# Patient Record
Sex: Female | Born: 1955 | Race: White | Hispanic: No | Marital: Married | Smoking: Never smoker
Health system: Southern US, Community
[De-identification: ages and names within clinical notes are randomized; demographics above are authoritative.]

## PROBLEM LIST (undated history)

## (undated) DIAGNOSIS — C50919 Malignant neoplasm of unspecified site of unspecified female breast: Secondary | ICD-10-CM

## (undated) HISTORY — DX: Malignant neoplasm of unspecified site of unspecified female breast: C50.919

---

## 1997-01-09 HISTORY — PX: MASTECTOMY: SHX3

## 2004-10-24 ENCOUNTER — Emergency Department: Payer: Self-pay | Admitting: Emergency Medicine

## 2006-06-25 ENCOUNTER — Ambulatory Visit: Payer: Self-pay | Admitting: Obstetrics and Gynecology

## 2008-06-23 ENCOUNTER — Ambulatory Visit: Payer: Self-pay | Admitting: Obstetrics and Gynecology

## 2008-06-24 ENCOUNTER — Ambulatory Visit: Payer: Self-pay | Admitting: Obstetrics and Gynecology

## 2009-07-07 ENCOUNTER — Ambulatory Visit: Payer: Self-pay | Admitting: Obstetrics and Gynecology

## 2010-09-26 ENCOUNTER — Ambulatory Visit: Payer: Self-pay | Admitting: Obstetrics and Gynecology

## 2013-01-20 ENCOUNTER — Ambulatory Visit: Payer: Self-pay | Admitting: Obstetrics and Gynecology

## 2013-03-17 ENCOUNTER — Ambulatory Visit: Payer: Self-pay | Admitting: Unknown Physician Specialty

## 2013-12-03 DIAGNOSIS — R079 Chest pain, unspecified: Secondary | ICD-10-CM | POA: Insufficient documentation

## 2015-05-27 ENCOUNTER — Other Ambulatory Visit: Payer: Self-pay | Admitting: Obstetrics and Gynecology

## 2015-05-27 DIAGNOSIS — Z1231 Encounter for screening mammogram for malignant neoplasm of breast: Secondary | ICD-10-CM

## 2015-06-15 ENCOUNTER — Ambulatory Visit: Payer: Self-pay

## 2015-06-17 ENCOUNTER — Ambulatory Visit: Payer: Self-pay

## 2015-06-23 ENCOUNTER — Other Ambulatory Visit: Payer: Self-pay | Admitting: Obstetrics and Gynecology

## 2015-06-23 ENCOUNTER — Ambulatory Visit
Admission: RE | Admit: 2015-06-23 | Discharge: 2015-06-23 | Disposition: A | Discharge status: Home / Self Care | Payer: BLUE CROSS/BLUE SHIELD | Source: Ambulatory Visit | Attending: Obstetrics and Gynecology | Admitting: Obstetrics and Gynecology

## 2015-06-23 DIAGNOSIS — Z1231 Encounter for screening mammogram for malignant neoplasm of breast: Secondary | ICD-10-CM

## 2015-08-04 ENCOUNTER — Inpatient Hospital Stay: Payer: BLUE CROSS/BLUE SHIELD

## 2015-08-04 ENCOUNTER — Encounter (INDEPENDENT_AMBULATORY_CARE_PROVIDER_SITE_OTHER): Payer: Self-pay

## 2015-08-04 ENCOUNTER — Encounter: Payer: Self-pay | Admitting: Oncology

## 2015-08-04 ENCOUNTER — Inpatient Hospital Stay: Payer: BLUE CROSS/BLUE SHIELD | Attending: Oncology | Admitting: Oncology

## 2015-08-04 DIAGNOSIS — Z853 Personal history of malignant neoplasm of breast: Secondary | ICD-10-CM

## 2015-08-04 DIAGNOSIS — Z803 Family history of malignant neoplasm of breast: Secondary | ICD-10-CM | POA: Diagnosis not present

## 2015-08-04 DIAGNOSIS — Z9011 Acquired absence of right breast and nipple: Secondary | ICD-10-CM

## 2015-08-04 DIAGNOSIS — Z1379 Encounter for other screening for genetic and chromosomal anomalies: Secondary | ICD-10-CM

## 2015-08-07 NOTE — Progress Notes (Signed)
[facility]  [phone] [phone]  ID: Sara Herman OB: Jul 12, 1955  MR#: [id number]  [id number]  No care team member to display  CHIEF COMPLAINT: Genetic testing for personal history of breast cancer.  INTERVAL HISTORY: Patient is a 60 year old female with a personal history of breast cancer at the age of 89. Patient indicates she only had treatment with mastectomy. She did not have radiation, chemotherapy, or adjuvant hormonal treatment. She also has a grandmother that had breast cancer in her 74s. She has no other evidence of malignancy in her family. Currently, she feels well and is asymptomatic. She is no neurologic complaints. She denies any recent fevers or illnesses. She denies any pain. She has a good appetite and denies weight loss. She denies any chest pain or shortness of breath. She denies any nausea, vomiting, constipation, or diarrhea. She has no urinary complaints. Patient feels at her baseline and offers no specific complaints today.  REVIEW OF SYSTEMS:   Review of Systems  Constitutional: Negative.  Negative for fever, malaise/fatigue and weight loss.  Respiratory: Negative.  Negative for shortness of breath.   Cardiovascular: Negative.  Negative for chest pain.  Gastrointestinal: Negative.   Genitourinary: Negative.   Musculoskeletal: Negative.   Neurological: Negative.  Negative for weakness.  Psychiatric/Behavioral: Negative.  The patient is not nervous/anxious.     As per HPI. Otherwise, a complete review of systems is negatve.  PAST MEDICAL HISTORY: Past Medical History:  Diagnosis Date  . Breast cancer ([place])     PAST SURGICAL HISTORY: Past Surgical History:  Procedure Laterality Date  . MASTECTOMY Right 1999    FAMILY HISTORY: Family History  Problem Relation Age of Onset  . Breast cancer Paternal Grandmother   . Heart Problems Father   . Diabetes Sister   . Heart Problems Sister         ADVANCED DIRECTIVES:    HEALTH MAINTENANCE: Social History  Substance Use Topics  . Smoking status: Never Smoker  . Smokeless tobacco: Never Used  . Alcohol use No     Colonoscopy:  PAP:  Bone density:  Lipid panel:  Allergies  Allergen Reactions  . Latex Rash and Dermatitis    No current outpatient prescriptions on file.   No current facility-administered medications for this visit.     OBJECTIVE: There were no vitals filed for this visit.   There is no height or weight on file to calculate BMI.    ECOG FS:0 - Asymptomatic  General: Well-developed, well-nourished, no acute distress. Eyes: Pink conjunctiva, anicteric sclera. Musculoskeletal: No edema, cyanosis, or clubbing. Neuro: Alert, answering all questions appropriately. Cranial nerves grossly intact. Skin: No rashes or petechiae noted. Psych: Normal affect. Lymphatics: No cervical, calvicular, axillary or inguinal LAD.   LAB RESULTS:  No results found for: NA, K, CL, CO2, GLUCOSE, BUN, CREATININE, CALCIUM, PROT, ALBUMIN, AST, ALT, ALKPHOS, BILITOT, GFRNONAA, GFRAA  No results found for: WBC, NEUTROABS, HGB, HCT, MCV, PLT   STUDIES: No results found.  ASSESSMENT: Genetic testing for personal history of breast cancer.  PLAN:    1. Genetic testing: Although patient had breast cancer nearly 17 years ago, she was under the age of 89 at her diagnosis therefore she will qualify for genetic testing today. She also has a family history of breast cancer in her paternal grandmother. Will proceed with My Risk testing today. Patient has 1 son and 1 daughter in their mid-20s that she is also interested in having tested if she  is positive. If negative, no further follow-up is necessary. She has been instructed to ensure that she and her daughter continue to get regular mammograms as indicated. If positive, patient will return to clinic to discuss the results as well as preventative options. We will also discuss if  and when to test her children at that time.  Approximately 45 minutes was spent in discussion of which greater than 50% was consultation.  Patient expressed understanding and was in agreement with this plan. She also understands that She can call clinic at any time with any questions, concerns, or complaints.   Sara Huger, MD   08/07/2015 11:54 AM

## 2015-08-19 ENCOUNTER — Telehealth: Payer: Self-pay | Admitting: *Deleted

## 2015-08-19 NOTE — Telephone Encounter (Signed)
Notified patient of negative genetic test result.

## 2015-08-23 ENCOUNTER — Telehealth: Payer: Self-pay | Admitting: *Deleted

## 2015-08-23 NOTE — Telephone Encounter (Signed)
Pt got a letter from [facility] stating that [facility] testing was denied. Pt concerned and would like to discuss this letter with someone. Informed pt that the best contact to handle her situation would be [facility] who processed her MyRisk testing. Informed pt to contact [facility] at [phone]. Pt verbalized understanding.

## 2015-08-29 ENCOUNTER — Encounter: Payer: Self-pay | Admitting: Oncology

## 2015-10-19 ENCOUNTER — Telehealth: Payer: Self-pay | Admitting: *Deleted

## 2015-10-19 NOTE — Telephone Encounter (Signed)
States she got a denial from her insurance company that they will not cover the genetic testing she had done. She states [facility] is ready to turn her in to collections and would appreciate any help you can give her with this. She states she was told they would not run test if her insurance did not cover it and would appreciate anything we can do to assist her in this matter.

## 2015-10-20 NOTE — Telephone Encounter (Signed)
She did and they assure her she will owe nothing. This occurred after she and I spoke and I had advised her to call them

## 2015-10-20 NOTE — Telephone Encounter (Signed)
Our office will not be able to assist patient with this. She will need to discuss this with [facility].  

## 2015-11-24 ENCOUNTER — Other Ambulatory Visit: Payer: Self-pay | Admitting: Internal Medicine

## 2015-11-24 ENCOUNTER — Ambulatory Visit
Admission: RE | Admit: 2015-11-24 | Discharge: 2015-11-24 | Disposition: A | Discharge status: Home / Self Care | Payer: BLUE CROSS/BLUE SHIELD | Source: Ambulatory Visit | Attending: Internal Medicine | Admitting: Internal Medicine

## 2015-11-24 DIAGNOSIS — K5793 Diverticulitis of intestine, part unspecified, without perforation or abscess with bleeding: Secondary | ICD-10-CM

## 2015-11-24 DIAGNOSIS — K37 Unspecified appendicitis: Secondary | ICD-10-CM | POA: Diagnosis not present

## 2015-11-24 DIAGNOSIS — R1084 Generalized abdominal pain: Secondary | ICD-10-CM

## 2015-11-24 DIAGNOSIS — R509 Fever, unspecified: Secondary | ICD-10-CM

## 2015-11-24 DIAGNOSIS — K8020 Calculus of gallbladder without cholecystitis without obstruction: Secondary | ICD-10-CM | POA: Diagnosis not present

## 2015-11-24 DIAGNOSIS — K819 Cholecystitis, unspecified: Secondary | ICD-10-CM

## 2015-11-24 DIAGNOSIS — R109 Unspecified abdominal pain: Secondary | ICD-10-CM

## 2015-11-24 MED ORDER — IOPAMIDOL (ISOVUE-300) INJECTION 61%: 100.0000 mL | Freq: Once | INTRAVENOUS | Status: DC | PRN

## 2015-11-26 ENCOUNTER — Ambulatory Visit: Payer: BLUE CROSS/BLUE SHIELD | Admitting: Anesthesiology

## 2015-11-26 ENCOUNTER — Ambulatory Visit
Admission: RE | Admit: 2015-11-26 | Discharge: 2015-11-26 | Disposition: A | Discharge status: Home / Self Care | Payer: BLUE CROSS/BLUE SHIELD | Source: Ambulatory Visit | Attending: Surgery | Admitting: Surgery

## 2015-11-26 ENCOUNTER — Encounter: Payer: Self-pay | Admitting: *Deleted

## 2015-11-26 ENCOUNTER — Ambulatory Visit: Payer: BLUE CROSS/BLUE SHIELD

## 2015-11-26 ENCOUNTER — Encounter
Admission: RE | Disposition: A | Discharge status: Home / Self Care | Payer: Self-pay | Source: Ambulatory Visit | Attending: Surgery

## 2015-11-26 DIAGNOSIS — Z853 Personal history of malignant neoplasm of breast: Secondary | ICD-10-CM | POA: Diagnosis not present

## 2015-11-26 DIAGNOSIS — Z419 Encounter for procedure for purposes other than remedying health state, unspecified: Secondary | ICD-10-CM

## 2015-11-26 DIAGNOSIS — K8010 Calculus of gallbladder with chronic cholecystitis without obstruction: Secondary | ICD-10-CM | POA: Diagnosis present

## 2015-11-26 DIAGNOSIS — K8012 Calculus of gallbladder with acute and chronic cholecystitis without obstruction: Secondary | ICD-10-CM | POA: Insufficient documentation

## 2015-11-26 HISTORY — PX: CHOLECYSTECTOMY: SHX55

## 2015-11-26 SURGERY — LAPAROSCOPIC CHOLECYSTECTOMY
Anesthesia: General | Site: Abdomen | Wound class: Clean Contaminated

## 2015-11-26 MED ORDER — ONDANSETRON HCL 4 MG/2ML IJ SOLN
INTRAMUSCULAR | Status: DC | PRN
  Administered 2015-11-26 (×2): 4 mg via INTRAVENOUS

## 2015-11-26 MED ORDER — FENTANYL CITRATE (PF) 100 MCG/2ML IJ SOLN
INTRAMUSCULAR | Status: AC
  Administered 2015-11-26: 25 ug via INTRAVENOUS
  Filled 2015-11-26: qty 2

## 2015-11-26 MED ORDER — HEPARIN SODIUM (PORCINE) 1000 UNIT/ML IJ SOLN
INTRAMUSCULAR | Status: DC | PRN
  Administered 2015-11-26: 250 mL via INTRAMUSCULAR

## 2015-11-26 MED ORDER — SODIUM CHLORIDE 0.9 % IJ SOLN
INTRAMUSCULAR | Status: AC
  Filled 2015-11-26: qty 10

## 2015-11-26 MED ORDER — DEXAMETHASONE SODIUM PHOSPHATE 10 MG/ML IJ SOLN
INTRAMUSCULAR | Status: DC | PRN
  Administered 2015-11-26: 4 mg via INTRAVENOUS

## 2015-11-26 MED ORDER — HEPARIN SODIUM (PORCINE) 5000 UNIT/ML IJ SOLN
INTRAMUSCULAR | Status: AC
  Filled 2015-11-26: qty 1

## 2015-11-26 MED ORDER — KETOROLAC TROMETHAMINE 30 MG/ML IJ SOLN
INTRAMUSCULAR | Status: AC
  Administered 2015-11-26: 30 mg via INTRAVENOUS
  Filled 2015-11-26: qty 1

## 2015-11-26 MED ORDER — SUGAMMADEX SODIUM 200 MG/2ML IV SOLN
INTRAVENOUS | Status: DC | PRN
  Administered 2015-11-26: 200 mg via INTRAVENOUS

## 2015-11-26 MED ORDER — PROPOFOL 10 MG/ML IV BOLUS
INTRAVENOUS | Status: DC | PRN
  Administered 2015-11-26: 150 mg via INTRAVENOUS

## 2015-11-26 MED ORDER — KETOROLAC TROMETHAMINE 30 MG/ML IJ SOLN
30.0000 mg | Freq: Once | INTRAMUSCULAR | Status: AC
  Administered 2015-11-26: 30 mg via INTRAVENOUS

## 2015-11-26 MED ORDER — FENTANYL CITRATE (PF) 100 MCG/2ML IJ SOLN
25.0000 ug | INTRAMUSCULAR | Status: AC | PRN
  Administered 2015-11-26 (×6): 25 ug via INTRAVENOUS

## 2015-11-26 MED ORDER — PROMETHAZINE HCL 25 MG/ML IJ SOLN
INTRAMUSCULAR | Status: AC
  Administered 2015-11-26: 12.5 mg via INTRAVENOUS
  Filled 2015-11-26: qty 1

## 2015-11-26 MED ORDER — FENTANYL CITRATE (PF) 100 MCG/2ML IJ SOLN
INTRAMUSCULAR | Status: DC | PRN
  Administered 2015-11-26 (×2): 50 ug via INTRAVENOUS
  Administered 2015-11-26: 100 ug via INTRAVENOUS
  Administered 2015-11-26: 50 ug via INTRAVENOUS

## 2015-11-26 MED ORDER — SODIUM CHLORIDE 0.9 % IJ SOLN
INTRAMUSCULAR | Status: AC
  Filled 2015-11-26: qty 50

## 2015-11-26 MED ORDER — LACTATED RINGERS IV SOLN
INTRAVENOUS | Status: DC
  Administered 2015-11-26 (×3): via INTRAVENOUS

## 2015-11-26 MED ORDER — ONDANSETRON HCL 4 MG PO TABS: 4.0000 mg | ORAL_TABLET | ORAL | 0 refills | Status: AC | PRN

## 2015-11-26 MED ORDER — CEFOTETAN DISODIUM 2 G IJ SOLR
2.0000 g | Freq: Once | INTRAMUSCULAR | Status: AC
  Administered 2015-11-26: 2 g via INTRAVENOUS
  Filled 2015-11-26: qty 2

## 2015-11-26 MED ORDER — HYDROCODONE-ACETAMINOPHEN 5-325 MG PO TABS: 1.0000 | ORAL_TABLET | ORAL | 0 refills | Status: AC | PRN

## 2015-11-26 MED ORDER — MIDAZOLAM HCL 2 MG/2ML IJ SOLN
INTRAMUSCULAR | Status: DC | PRN
  Administered 2015-11-26: 2 mg via INTRAVENOUS

## 2015-11-26 MED ORDER — PROMETHAZINE HCL 25 MG/ML IJ SOLN
6.2500 mg | INTRAMUSCULAR | Status: AC | PRN
  Administered 2015-11-26 (×2): 12.5 mg via INTRAVENOUS

## 2015-11-26 MED ORDER — HYDROCODONE-ACETAMINOPHEN 5-325 MG PO TABS: 1.0000 | ORAL_TABLET | ORAL | Status: DC | PRN

## 2015-11-26 MED ORDER — PHENYLEPHRINE HCL 10 MG/ML IJ SOLN
INTRAMUSCULAR | Status: DC | PRN
  Administered 2015-11-26 (×3): 100 ug via INTRAVENOUS

## 2015-11-26 MED ORDER — ROCURONIUM BROMIDE 100 MG/10ML IV SOLN
INTRAVENOUS | Status: DC | PRN
  Administered 2015-11-26: 10 mg via INTRAVENOUS
  Administered 2015-11-26: 40 mg via INTRAVENOUS
  Administered 2015-11-26: 20 mg via INTRAVENOUS

## 2015-11-26 SURGICAL SUPPLY — 38 items
APPLIER CLIP ROT 10 11.4 M/L (STAPLE) ×4
CANISTER SUCT 1200ML W/VALVE (MISCELLANEOUS) ×4 IMPLANT
CANNULA DILATOR 10 W/SLV (CANNULA) ×3 IMPLANT
CANNULA DILATOR 10MM W/SLV (CANNULA) ×1
CATH REDDICK CHOLANGI 4FR 50CM (CATHETERS) ×4 IMPLANT
CHLORAPREP W/TINT 26ML (MISCELLANEOUS) ×4 IMPLANT
CLIP APPLIE ROT 10 11.4 M/L (STAPLE) ×2 IMPLANT
CLOSURE WOUND 1/2 X4 (GAUZE/BANDAGES/DRESSINGS) ×1
DRAPE SHEET LG 3/4 BI-LAMINATE (DRAPES) ×4 IMPLANT
ELECT REM PT RETURN 9FT ADLT (ELECTROSURGICAL) ×4
ELECTRODE REM PT RTRN 9FT ADLT (ELECTROSURGICAL) ×2 IMPLANT
GAUZE SPONGE 4X4 12PLY STRL (GAUZE/BANDAGES/DRESSINGS) ×4 IMPLANT
GLOVE BIO SURGEON STRL SZ7.5 (GLOVE) ×4 IMPLANT
GOWN STRL REUS W/ TWL LRG LVL3 (GOWN DISPOSABLE) ×8 IMPLANT
GOWN STRL REUS W/TWL LRG LVL3 (GOWN DISPOSABLE) ×8
IRRIGATION STRYKERFLOW (MISCELLANEOUS) ×2 IMPLANT
IRRIGATOR STRYKERFLOW (MISCELLANEOUS) ×4
IV NS 1000ML (IV SOLUTION) ×2
IV NS 1000ML BAXH (IV SOLUTION) ×2 IMPLANT
KIT RM TURNOVER STRD PROC AR (KITS) ×4 IMPLANT
LABEL OR SOLS (LABEL) ×4 IMPLANT
NDL INSUFF ACCESS 14 VERSASTEP (NEEDLE) ×4 IMPLANT
NEEDLE FILTER BLUNT 18X 1/2SAF (NEEDLE) ×2
NEEDLE FILTER BLUNT 18X1 1/2 (NEEDLE) ×2 IMPLANT
NS IRRIG 500ML POUR BTL (IV SOLUTION) ×4 IMPLANT
PACK LAP CHOLECYSTECTOMY (MISCELLANEOUS) ×4 IMPLANT
SCISSORS METZENBAUM CVD 33 (INSTRUMENTS) ×4 IMPLANT
SEAL FOR SCOPE WARMER C3101 (MISCELLANEOUS) ×4 IMPLANT
SLEEVE ENDOPATH XCEL 5M (ENDOMECHANICALS) ×4 IMPLANT
STRIP CLOSURE SKIN 1/2X4 (GAUZE/BANDAGES/DRESSINGS) ×3 IMPLANT
SUT CHROMIC 5 0 RB 1 27 (SUTURE) ×8 IMPLANT
SUT MAXON ABS #0 GS21 30IN (SUTURE) ×4 IMPLANT
SUT VIC AB 0 CT2 27 (SUTURE) IMPLANT
SYR 3ML LL SCALE MARK (SYRINGE) ×4 IMPLANT
TROCAR XCEL NON-BLD 11X100MML (ENDOMECHANICALS) ×4 IMPLANT
TROCAR XCEL NON-BLD 5MMX100MML (ENDOMECHANICALS) ×4 IMPLANT
TUBING INSUFFLATOR HI FLOW (MISCELLANEOUS) ×4 IMPLANT
WATER STERILE IRR 1000ML POUR (IV SOLUTION) ×4 IMPLANT

## 2015-11-26 NOTE — Op Note (Signed)
OPERATIVE REPORT  PREOPERATIVE DIAGNOSIS:  Chronic cholecystitis cholelithiasis  POSTOPERATIVE DIAGNOSIS: Chronic cholecystitis cholelithiasis  PROCEDURE: Laparoscopic cholecystectomy  ANESTHESIA: General  SURGEON: Rochel Brome M.D.  INDICATIONS: She has had recent epigastric pains and CT findings of cholecystitis cholelithiasis with thickened gallbladder wall. Surgery was recommended for definitive treatment  With the patient on the operating table in the supine position under general endotracheal anesthesia the abdomen was prepared with ChloraPrep solution and draped in a sterile manner. A short incision was made in the inferior aspect of the umbilicus and carried down to the deep fascia which was grasped with a laryngeal hook. A Veress needle was inserted aspirated and irrigated with a saline solution. The peritoneal cavity was insufflated with carbon dioxide. The Veress needle was removed. The 10 mm cannula was inserted. The 10 mm 0 laparoscope was inserted to view the peritoneal cavity.  Another incision was made in the epigastrium slightly to the right of the midline to introduce an 11 mm cannula. 2 incisions were made in the lateral aspect of the right upper quadrant to introduce 2   5 mm cannulas. Initial inspection revealed the smooth surface of the liver. The gallbladder appeared to be acutely inflamed with thickened wall. The stomach was distended. I had the anesthetist insert an oral gastric tube to decompress the stomach. Brief survey of the visible intestine appeared typical. The gallbladder was decompressed with a lancing needle draining white bile. The gallbladder was retracted towards the right shoulder.  The gallbladder neck was retracted inferiorly and laterally.  The porta hepatis was identified. The gallbladder was mobilized with incision of the visceral peritoneum. It appeared that there was a large smooth stone impacted in the bladder neck. The cystic duct was dissected free  from surrounding structures. The cystic artery was dissected free from surrounding structures.  The cystic duct was very small and too small for Reddick catheter insertion and was ligated with endoclips and divided. The cystic artery was controlled with double endoclips and divided. The gallbladder was dissected free from the liver with use of hook and cautery and blunt dissection. There was a smooth plane of dissection. There was much thickening of the gallbladder and this became a somewhat tedious dissection. Bleeding was minimal and hemostasis was intact. The gallbladder was delivered up through the infraumbilical incision opened and suctioned. . Due to the large size of the stones and the thickness of the gallbladder wall was necessary to lengthen the infraumbilical incision to approximately 2 cm and also lengthened the fascial defect. The gallbladder was removed and submitted in formalin for routine pathology. The right upper quadrant was further inspected and irrigated and aspirated and hemostasis was intact. The cannulas removed allowing carbon dioxide to escape from the peritoneal cavity. The fascial defect at the umbilicus was closed with a 0 Maxon figure-of-eight suture followed by a simple suture. The skin incisions were closed with interrupted 5-0 chromic subcutaneous suture benzoin and Steri-Strips. Gauze dressings were applied with paper tape.  The patient appeared to be in satisfactory condition and was prepared for transfer to the recovery room  Randall.D.

## 2015-11-26 NOTE — Transfer of Care (Signed)
Immediate Anesthesia Transfer of Care Note  Patient: Sara Herman  Procedure(s) Performed: Procedure(s): LAPAROSCOPIC CHOLECYSTECTOMY (N/A)  Patient Location: PACU  Anesthesia Type:General  Level of Consciousness: sedated  Airway & Oxygen Therapy: Patient Spontanous Breathing and Patient connected to face mask oxygen  Post-op Assessment: Report given to RN and Post -op Vital signs reviewed and stable  Post vital signs: Reviewed and stable  Last Vitals:  Vitals:   11/26/15 1245 11/26/15 1651  BP: (!) 141/77 (!) 150/89  Pulse: 67 80  Resp: 18 16  Temp: 36.9 C [id number] C    Complications: No apparent anesthesia complications

## 2015-11-26 NOTE — H&P (Signed)
  She reports no change in condition since yesterday.  Did have small BM last night.  Abdomen soft and flat with RUQ tenderness  Discussed plan for lap cholecystectomy

## 2015-11-26 NOTE — Anesthesia Preprocedure Evaluation (Signed)
Anesthesia Evaluation  Patient identified by MRN, date of birth, ID band Patient awake    Reviewed: Allergy & Precautions, H&P , NPO status , Patient's Chart, lab work & pertinent test results, reviewed documented beta blocker date and time   History of Anesthesia Complications Negative for: history of anesthetic complications  Airway Mallampati: I  TM Distance: >3 FB Neck ROM: full    Dental  (+) Caps, Teeth Intact   Pulmonary neg pulmonary ROS,           Cardiovascular Exercise Tolerance: Good negative cardio ROS       Neuro/Psych negative neurological ROS  negative psych ROS   GI/Hepatic negative GI ROS, Neg liver ROS,   Endo/Other  negative endocrine ROS  Renal/GU negative Renal ROS  negative genitourinary   Musculoskeletal   Abdominal   Peds  Hematology negative hematology ROS (+)   Anesthesia Other Findings Past Medical History: No date: Breast cancer ([place])   Reproductive/Obstetrics negative OB ROS                             Anesthesia Physical Anesthesia Plan  ASA: II  Anesthesia Plan: General, Rapid Sequence and Cricoid Pressure   Post-op Pain Management:    Induction:   Airway Management Planned:   Additional Equipment:   Intra-op Plan:   Post-operative Plan:   Informed Consent: I have reviewed the patients History and Physical, chart, labs and discussed the procedure including the risks, benefits and alternatives for the proposed anesthesia with the patient or authorized representative who has indicated his/her understanding and acceptance.   Dental Advisory Given  Plan Discussed with: Anesthesiologist, CRNA and Surgeon  Anesthesia Plan Comments:         Anesthesia Quick Evaluation

## 2015-11-26 NOTE — Discharge Instructions (Addendum)
Take Tylenol or Norco as needed for pain.  Should not drive or do anything dangerous going taking Norco.  Can discontinue Cipro and Flagyl.  Continue Zantac and Protonix for 1 week.  Remove dressings on Sunday. May shower Monday.   AMBULATORY SURGERY  DISCHARGE INSTRUCTIONS   1) The drugs that you were given will stay in your system until tomorrow so for the next 24 hours you should not:  A) Drive an automobile B) Make any legal decisions C) Drink any alcoholic beverage   2) You may resume regular meals tomorrow.  Today it is better to start with liquids and gradually work up to solid foods.  You may eat anything you prefer, but it is better to start with liquids, then soup and crackers, and gradually work up to solid foods.   3) Please notify your doctor immediately if you have any unusual bleeding, trouble breathing, redness and pain at the surgery site, drainage, fever, or pain not relieved by medication.    4) Additional Instructions:        Please contact your physician with any problems or Same Day Surgery at [phone], Monday through Friday 6 am to 4 pm, or [facility] number at [phone].

## 2015-11-26 NOTE — Anesthesia Procedure Notes (Signed)
Procedure Name: Intubation Date/Time: 11/26/2015 2:34 PM Performed by: Leander Rams Pre-anesthesia Checklist: Patient identified, Emergency Drugs available, Suction available and Patient being monitored Patient Re-evaluated:Patient Re-evaluated prior to inductionOxygen Delivery Method: Circle system utilized Preoxygenation: Pre-oxygenation with 100% oxygen Intubation Type: IV induction Ventilation: Mask ventilation without difficulty Laryngoscope Size: Mac and 3 Grade View: Grade I Tube type: Oral Tube size: 6.5 mm Number of attempts: 1 Secured at: 20 cm Tube secured with: Tape Dental Injury: Teeth and Oropharynx as per pre-operative assessment

## 2015-11-26 NOTE — Anesthesia Postprocedure Evaluation (Signed)
Anesthesia Post Note  Patient: Sara Herman  Procedure(s) Performed: Procedure(s) (LRB): LAPAROSCOPIC CHOLECYSTECTOMY (N/A)  Patient location during evaluation: PACU Anesthesia Type: General Level of consciousness: awake and alert Pain management: pain level controlled Vital Signs Assessment: post-procedure vital signs reviewed and stable Respiratory status: spontaneous breathing, nonlabored ventilation and respiratory function stable Cardiovascular status: blood pressure returned to baseline and stable Postop Assessment: no signs of nausea or vomiting Anesthetic complications: no    Last Vitals:  Vitals:   11/26/15 1751 11/26/15 1757  BP: 138/64 (!) 142/72  Pulse: 71 71  Resp: 11 18  Temp: 37.4 C 37.2 C    Last Pain:  Vitals:   11/26/15 1757  TempSrc: Temporal  PainSc: 3                  Jaiyana Canale

## 2015-11-29 ENCOUNTER — Encounter: Payer: Self-pay | Admitting: Surgery

## 2015-11-30 LAB — SURGICAL PATHOLOGY

## 2017-03-15 IMAGING — CT CT ABD-PELV W/ CM
2 of 5 series · 15 of 46 positions shown, 17 images · IV contrast (isovue)
Comparison: None.

CLINICAL DATA: Diffuse abdominal cramping since [REDACTED]. Worsening
over the last 2 weeks. Nausea and vomiting.

EXAM:
CT ABDOMEN AND PELVIS WITH CONTRAST
TECHNIQUE: Multidetector CT imaging of the abdomen and pelvis was performed
using the standard protocol following bolus administration of
intravenous contrast.
CONTRAST:  100 cc Isovue 300

[Series 2: axial st · axial · 0.75mm/px · z∈[-956,-552]mm · 12 of 91 slices shown, 14 images]
[im 5/91  soft-tissue]
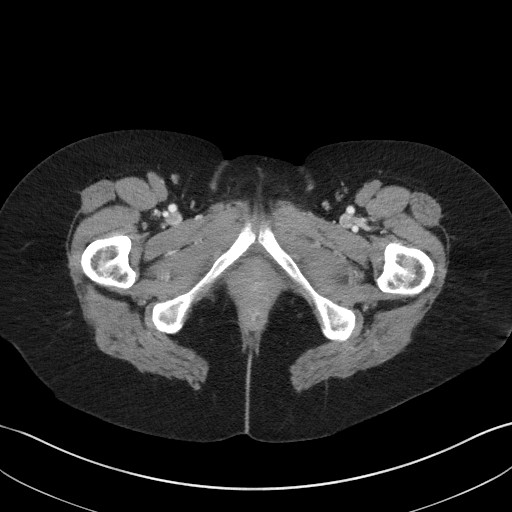
[im 5/91  bone]
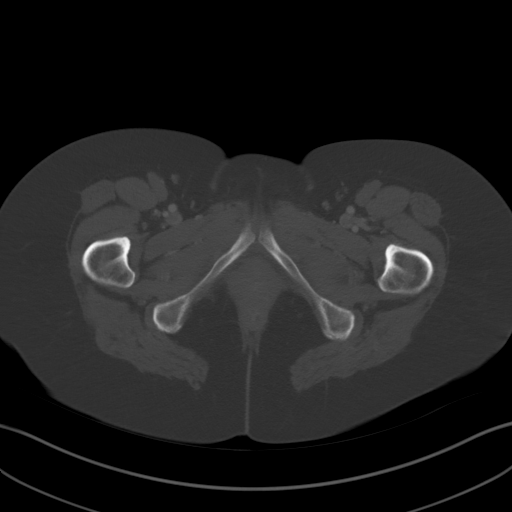
[im 15/91  soft-tissue]
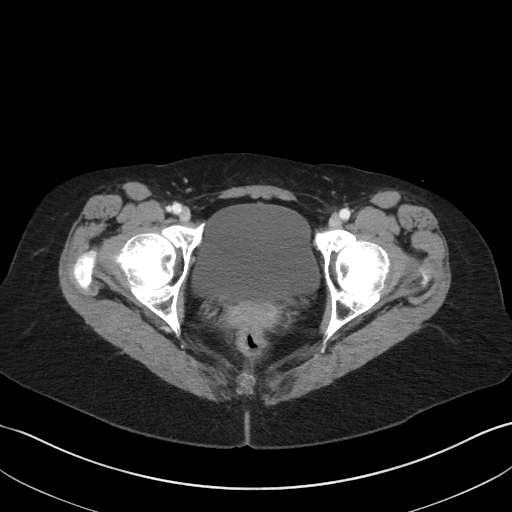
[im 19/91  soft-tissue]
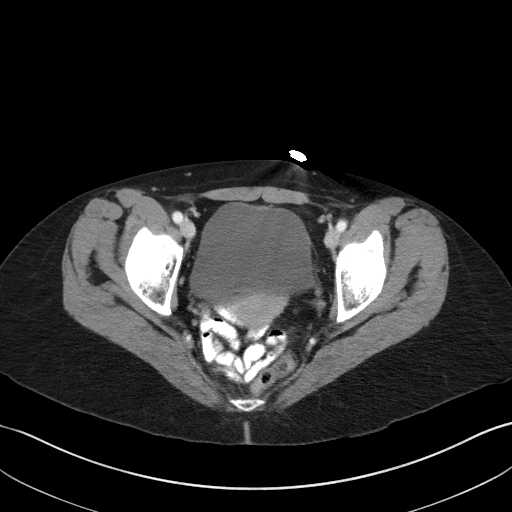
[im 29/91  soft-tissue]
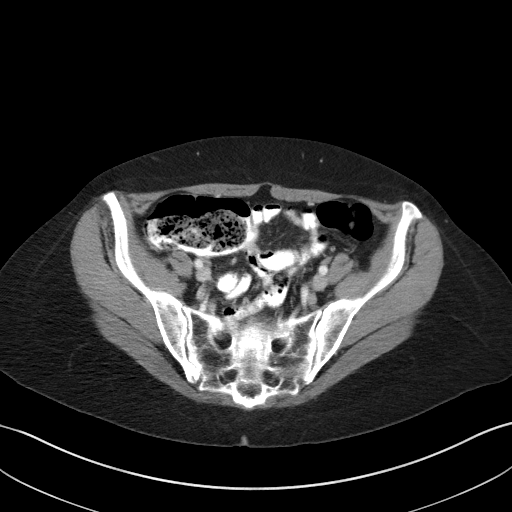
[im 34/91  soft-tissue]
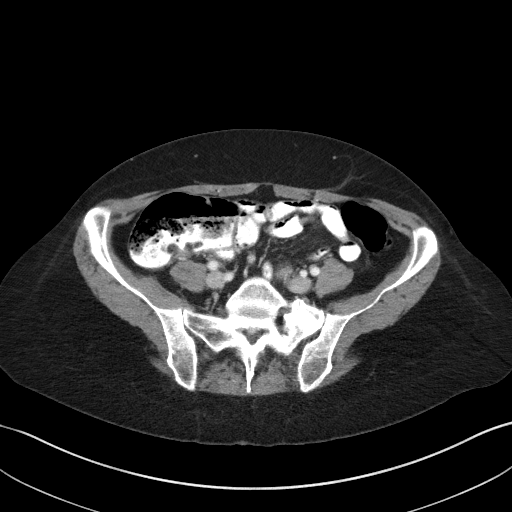
[im 43/91  soft-tissue]
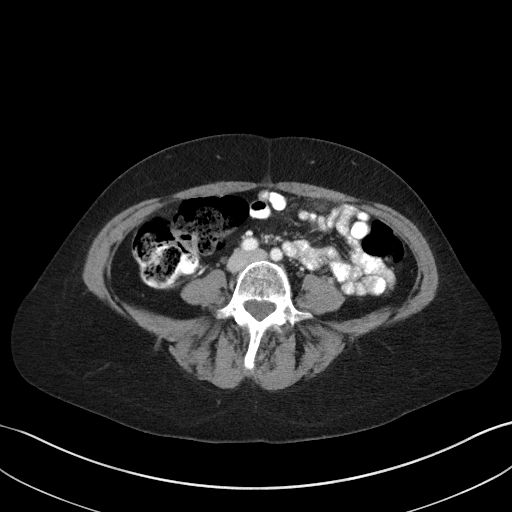
[im 48/91  soft-tissue]
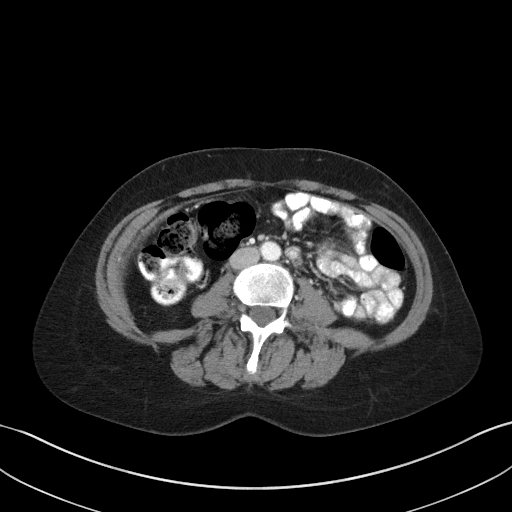
[im 57/91  soft-tissue]
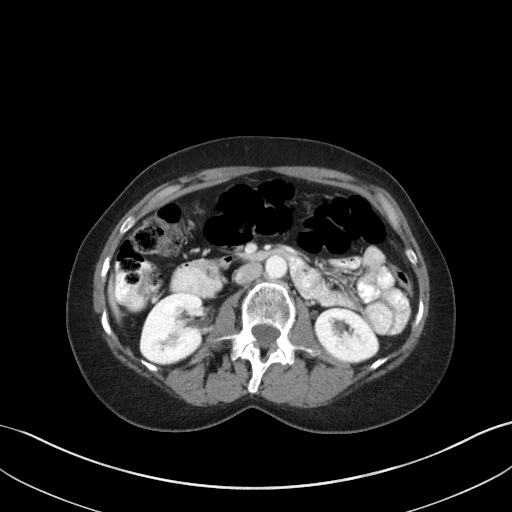
[im 62/91  soft-tissue]
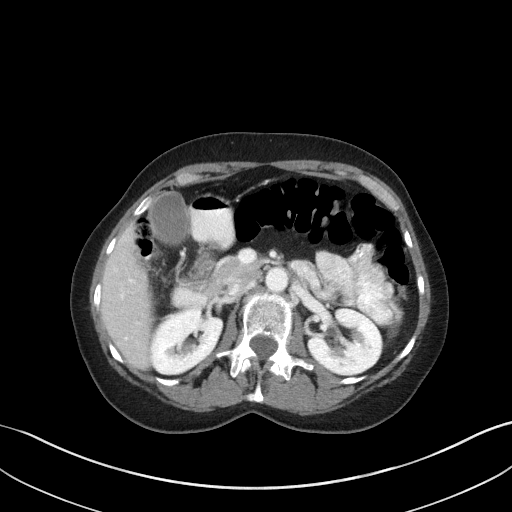
[im 62/91  bone]
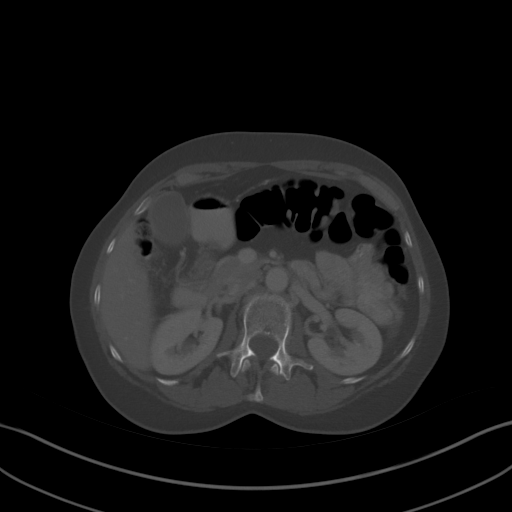
[im 72/91  soft-tissue]
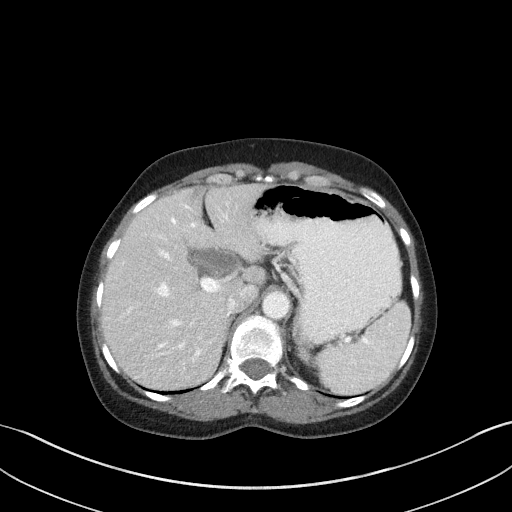
[im 76/91  soft-tissue]
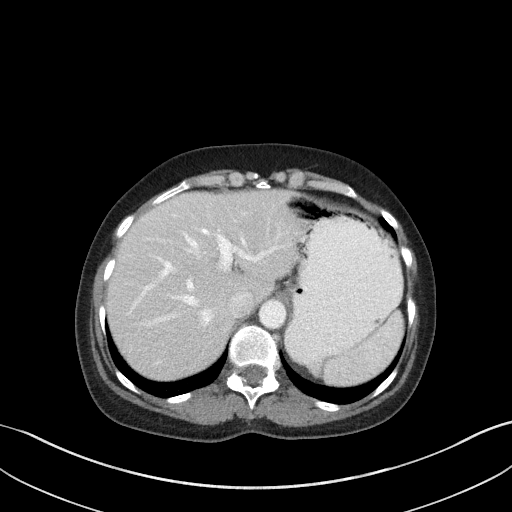
[im 86/91  soft-tissue]
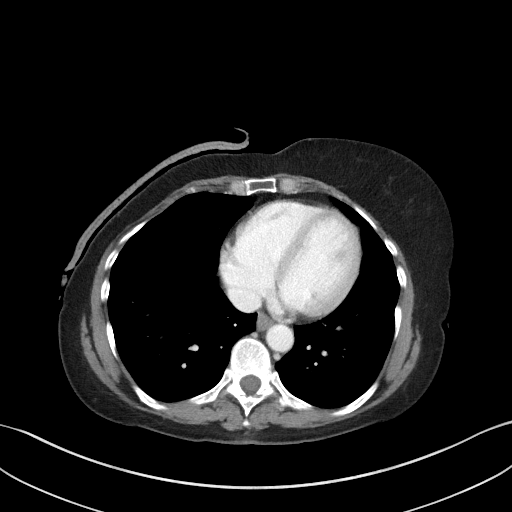

[Series 5: coronal st · coronal · 0.61mm/px · 3 of 73 slices shown]
[im 25/73  soft-tissue]
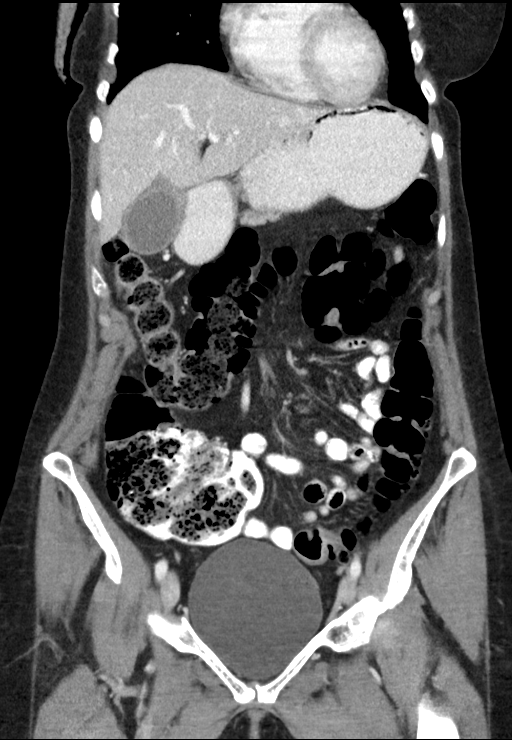
[im 33/73  soft-tissue]
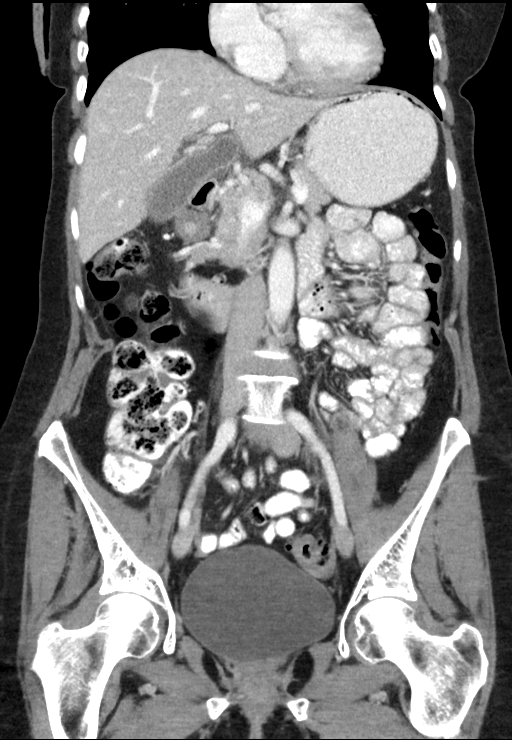
[im 41/73  soft-tissue]
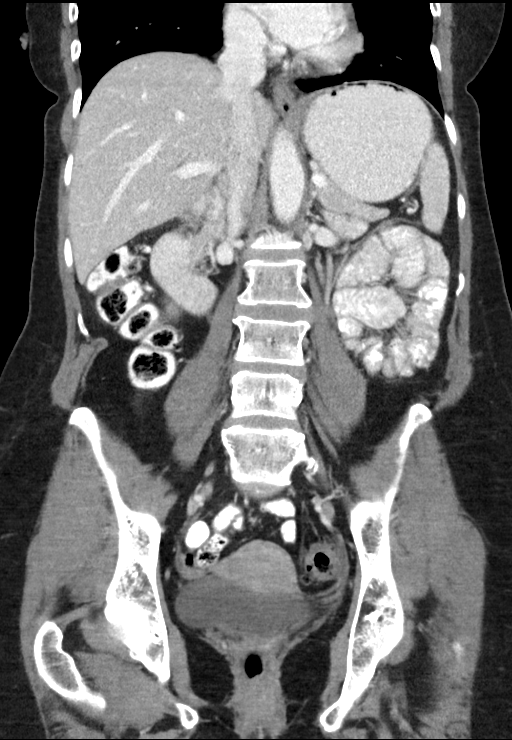

[15 of 46 positions shown; findings below may reference images not displayed]

FINDINGS: Lower chest:  Unremarkable

Hepatobiliary: No focal abnormality within the liver parenchyma.
Gallbladder is distended with a suggestion of gallbladder wall
thickening. Noncalcified stones are visible in the lumen (see image
28 series 2). No intrahepatic or extrahepatic biliary dilation.

Pancreas: No focal mass lesion. No dilatation of the main duct. No
intraparenchymal cyst. No peripancreatic edema.

Spleen: No splenomegaly. No focal mass lesion.

Adrenals/Urinary Tract: No adrenal nodule or mass. Right kidney
unremarkable. No enhancing abnormality in the left kidney. No
evidence for hydroureter. The urinary bladder appears normal for the
degree of distention.

Stomach/Bowel: Stomach is distended with contrast material.
Otherwise unremarkable. Duodenum is normally positioned as is the
ligament of [place]. No small bowel wall thickening. No small bowel
dilatation. The terminal ileum is normal. The appendix is normal.
Diverticular changes are noted in the left colon without evidence of
diverticulitis.

Vascular/Lymphatic: No abdominal aortic aneurysm. No abdominal
aortic atherosclerotic calcification. There is no gastrohepatic or
hepatoduodenal ligament lymphadenopathy. No intraperitoneal or
retroperitoneal lymphadenopathy. No pelvic sidewall lymphadenopathy.

Reproductive: The uterus has normal CT imaging appearance. There is
no adnexal mass.

Other: No intraperitoneal free fluid.

Musculoskeletal: Bone windows reveal no worrisome lytic or sclerotic
osseous lesions.
IMPRESSION: 1. Noncalcified gallstones with a suggestion by CT of mild
gallbladder wall thickening. Cholecystitis a concern by CT.
Ultrasound may prove helpful to further evaluate as clinically
warranted.
2. Diverticular changes left colon without diverticulitis

These results will be called to the ordering clinician or
representative by the Radiologist Assistant, and communication
documented in the PACS or zVision Dashboard.

## 2019-04-07 DIAGNOSIS — Z853 Personal history of malignant neoplasm of breast: Secondary | ICD-10-CM | POA: Insufficient documentation

## 2019-04-07 DIAGNOSIS — E039 Hypothyroidism, unspecified: Secondary | ICD-10-CM | POA: Insufficient documentation

## 2019-12-12 ENCOUNTER — Other Ambulatory Visit: Payer: Self-pay | Admitting: Internal Medicine

## 2021-01-20 ENCOUNTER — Other Ambulatory Visit: Payer: Self-pay | Admitting: Obstetrics and Gynecology

## 2021-01-20 DIAGNOSIS — Z1231 Encounter for screening mammogram for malignant neoplasm of breast: Secondary | ICD-10-CM

## 2021-08-10 DIAGNOSIS — N950 Postmenopausal bleeding: Secondary | ICD-10-CM | POA: Insufficient documentation

## 2022-01-31 ENCOUNTER — Other Ambulatory Visit: Payer: Self-pay | Admitting: Internal Medicine

## 2022-01-31 DIAGNOSIS — Z1231 Encounter for screening mammogram for malignant neoplasm of breast: Secondary | ICD-10-CM

## 2022-08-23 ENCOUNTER — Encounter: Payer: Self-pay | Admitting: Dermatology

## 2022-08-23 ENCOUNTER — Ambulatory Visit: Payer: Medicare HMO | Admitting: Dermatology

## 2022-08-23 DIAGNOSIS — D1801 Hemangioma of skin and subcutaneous tissue: Secondary | ICD-10-CM

## 2022-08-23 DIAGNOSIS — Z1283 Encounter for screening for malignant neoplasm of skin: Secondary | ICD-10-CM

## 2022-08-23 DIAGNOSIS — R238 Other skin changes: Secondary | ICD-10-CM | POA: Diagnosis not present

## 2022-08-23 DIAGNOSIS — L814 Other melanin hyperpigmentation: Secondary | ICD-10-CM | POA: Diagnosis not present

## 2022-08-23 DIAGNOSIS — L578 Other skin changes due to chronic exposure to nonionizing radiation: Secondary | ICD-10-CM

## 2022-08-23 DIAGNOSIS — W908XXA Exposure to other nonionizing radiation, initial encounter: Secondary | ICD-10-CM

## 2022-08-23 DIAGNOSIS — D229 Melanocytic nevi, unspecified: Secondary | ICD-10-CM

## 2022-08-23 DIAGNOSIS — L821 Other seborrheic keratosis: Secondary | ICD-10-CM | POA: Diagnosis not present

## 2022-08-23 NOTE — Progress Notes (Signed)
   New Patient Visit   Subjective  Sara Herman is a 67 y.o. female who presents for the following: Skin Cancer Screening and Full Body Skin Exam  The patient presents for Total-Body Skin Exam (TBSE) for skin cancer screening and mole check. The patient has spots, moles and lesions to be evaluated, some may be new or changing and the patient may have concern these could be cancer.  Patient has a white spot at right nose that is sometimes painful to touch, present for about 6 months. Sometimes has a little scab. No personal or fhx skin cancer.   The following portions of the chart were reviewed this encounter and updated as appropriate: medications, allergies, medical history  Review of Systems:  No other skin or systemic complaints except as noted in HPI or Assessment and Plan.  Objective  Well appearing patient in no apparent distress; mood and affect are within normal limits.  A full examination was performed including scalp, head, eyes, ears, nose, lips, neck, chest, axillae, abdomen, back, buttocks, bilateral upper extremities, bilateral lower extremities, hands, feet, fingers, toes, fingernails, and toenails. All findings within normal limits unless otherwise noted below.   Relevant physical exam findings are noted in the Assessment and Plan.  Exam of face limited by presence of make-up. Exam of nails limited by presence of nail polish.   right nasal ala Pink slightly eroded papule at right nasal ala 2 mm       Assessment & Plan   SKIN CANCER SCREENING PERFORMED TODAY.  ACTINIC DAMAGE - Chronic condition, secondary to cumulative UV/sun exposure - diffuse scaly erythematous macules with underlying dyspigmentation - Recommend daily broad spectrum sunscreen SPF 30+ to sun-exposed areas, reapply every 2 hours as needed.  - Staying in the shade or wearing long sleeves, sun glasses (UVA+UVB protection) and wide brim hats (4-inch brim around the entire circumference of  the hat) are also recommended for sun protection.  - Call for new or changing lesions.  LENTIGINES, SEBORRHEIC KERATOSES, HEMANGIOMAS - Benign normal skin lesions - Benign-appearing - Call for any changes  MELANOCYTIC NEVI - Tan-brown and/or pink-flesh-colored symmetric macules and papules - Benign appearing on exam today - Observation - Call clinic for new or changing moles - Recommend daily use of broad spectrum spf 30+ sunscreen to sun-exposed areas.    Papule of skin right nasal ala  Ddx trauma vs AK vs NMSC vs traumatized angiofibroma  No clear features of skin cancer. Recommended cryotherapy vs biopsy. Patient has an event today and prefers to wait. Recheck in 3-6 months  Multiple benign nevi  Lentigines  Seborrheic keratoses  Cherry angioma  Actinic elastosis    Return in about 5 months (around 01/23/2023) for recheck nose.  Anise Salvo, RMA, am acting as scribe for Elie Goody, MD .   Documentation: I have reviewed the above documentation for accuracy and completeness, and I agree with the above.  Elie Goody, MD

## 2022-08-23 NOTE — Patient Instructions (Addendum)

## 2022-09-06 DIAGNOSIS — M810 Age-related osteoporosis without current pathological fracture: Secondary | ICD-10-CM | POA: Insufficient documentation

## 2023-01-25 ENCOUNTER — Encounter: Payer: Self-pay | Admitting: Dermatology

## 2023-01-25 ENCOUNTER — Ambulatory Visit: Payer: Medicare HMO | Admitting: Dermatology

## 2023-01-25 DIAGNOSIS — L570 Actinic keratosis: Secondary | ICD-10-CM

## 2023-01-25 DIAGNOSIS — W098XXA Fall on or from other playground equipment, initial encounter: Secondary | ICD-10-CM | POA: Diagnosis not present

## 2023-01-25 NOTE — Progress Notes (Signed)
   Follow-Up Visit   Subjective  Sara Herman is a 68 y.o. female who presents for the following: follow up for spot on nose pt seen in August 2024. Pt states within past 2 weeks spot has healed tremendously.   The patient has spots, moles and lesions to be evaluated, some may be new or changing and the patient may have concern these could be cancer.   The following portions of the chart were reviewed this encounter and updated as appropriate: medications, allergies, medical history  Review of Systems:  No other skin or systemic complaints except as noted in HPI or Assessment and Plan.  Objective  Well appearing patient in no apparent distress; mood and affect are within normal limits.   A focused examination was performed of the following areas: Nose  Relevant exam findings are noted in the Assessment and Plan.  Right Ala Nasi x1 Pink scaly micropapule  Assessment & Plan   AK (ACTINIC KERATOSIS) Right Ala Nasi x1 Actinic keratoses are precancerous spots that appear secondary to cumulative UV radiation exposure/sun exposure over time. They are chronic with expected duration over 1 year. A portion of actinic keratoses will progress to squamous cell carcinoma of the skin. It is not possible to reliably predict which spots will progress to skin cancer and so treatment is recommended to prevent development of skin cancer.  Recommend daily broad spectrum sunscreen SPF 30+ to sun-exposed areas, reapply every 2 hours as needed.  Recommend staying in the shade or wearing long sleeves, sun glasses (UVA+UVB protection) and wide brim hats (4-inch brim around the entire circumference of the hat). Call for new or changing lesions. Destruction of lesion - Right Ala Nasi x1 Complexity: simple   Destruction method: cryotherapy   Informed consent: discussed and consent obtained   Timeout:  patient name, date of birth, surgical site, and procedure verified Lesion destroyed using liquid  nitrogen: Yes   Region frozen until ice ball extended beyond lesion: Yes   Cryo cycles: 1 or 2. Outcome: patient tolerated procedure well with no complications   Post-procedure details: wound care instructions given    Return in about 1 month (around 02/25/2023) for recheck spot on nose with Dr. Katrinka Blazing .  Wynonia Lawman, CMA, am acting as scribe for Elie Goody, MD .   Documentation: I have reviewed the above documentation for accuracy and completeness, and I agree with the above.  Elie Goody, MD

## 2023-01-25 NOTE — Patient Instructions (Addendum)

## 2023-03-06 ENCOUNTER — Encounter: Payer: Self-pay | Admitting: Dermatology

## 2023-03-06 ENCOUNTER — Ambulatory Visit: Payer: Medicare HMO | Admitting: Dermatology

## 2023-03-06 DIAGNOSIS — L570 Actinic keratosis: Secondary | ICD-10-CM

## 2023-03-06 DIAGNOSIS — W908XXD Exposure to other nonionizing radiation, subsequent encounter: Secondary | ICD-10-CM

## 2023-03-06 NOTE — Patient Instructions (Signed)

## 2023-03-06 NOTE — Progress Notes (Signed)
   Follow-Up Visit   Subjective  Sara Herman is a 68 y.o. female who presents for the following: recheck spot on R nose treated with LN2 at last visit, pt reports spot has healed up pretty well.   The patient has spots, moles and lesions to be evaluated, some may be new or changing and the patient may have concern these could be cancer.   The following portions of the chart were reviewed this encounter and updated as appropriate: medications, allergies, medical history  Review of Systems:  No other skin or systemic complaints except as noted in HPI or Assessment and Plan.  Objective  Well appearing patient in no apparent distress; mood and affect are within normal limits.   A focused examination was performed of the following areas: Nose  Relevant exam findings are noted in the Assessment and Plan.    Assessment & Plan    HISTORY OF PRECANCEROUS ACTINIC KERATOSIS - site(s) of PreCancerous Actinic Keratosis clear today, well-healed at R ala nasi, lesion resolved - these may recur and new lesions may form requiring treatment to prevent transformation into skin cancer - observe for new or changing spots and contact [facility] for appointment if occur - photoprotection with sun protective clothing; sunglasses and broad spectrum sunscreen with SPF of at least 30 + and frequent self skin exams recommended - yearly exams by a dermatologist recommended for persons with history of PreCancerous Actinic Keratoses    ACTINIC KERATOSES    Return in about 6 months (around 09/03/2023) for TBSE, w/ Dr. Katrinka Blazing.  Wynonia Lawman, CMA, am acting as scribe for Elie Goody, MD .   Documentation: I have reviewed the above documentation for accuracy and completeness, and I agree with the above.  Elie Goody, MD

## 2023-04-23 ENCOUNTER — Ambulatory Visit
Admission: RE | Admit: 2023-04-23 | Discharge: 2023-04-23 | Disposition: A | Discharge status: Home / Self Care | Source: Ambulatory Visit | Attending: Internal Medicine | Admitting: Internal Medicine

## 2023-04-23 ENCOUNTER — Other Ambulatory Visit: Payer: Self-pay | Admitting: Internal Medicine

## 2023-04-23 DIAGNOSIS — R413 Other amnesia: Secondary | ICD-10-CM | POA: Diagnosis present

## 2023-04-23 DIAGNOSIS — F4489 Other dissociative and conversion disorders: Secondary | ICD-10-CM | POA: Diagnosis present

## 2023-04-23 DIAGNOSIS — R519 Headache, unspecified: Secondary | ICD-10-CM

## 2023-09-03 ENCOUNTER — Ambulatory Visit: Payer: Medicare HMO | Admitting: Dermatology

## 2023-09-06 ENCOUNTER — Ambulatory Visit: Admitting: Dermatology

## 2023-09-06 ENCOUNTER — Encounter: Payer: Self-pay | Admitting: Dermatology

## 2023-09-06 DIAGNOSIS — L650 Telogen effluvium: Secondary | ICD-10-CM

## 2023-09-06 DIAGNOSIS — L578 Other skin changes due to chronic exposure to nonionizing radiation: Secondary | ICD-10-CM | POA: Diagnosis not present

## 2023-09-06 DIAGNOSIS — L821 Other seborrheic keratosis: Secondary | ICD-10-CM

## 2023-09-06 DIAGNOSIS — Z872 Personal history of diseases of the skin and subcutaneous tissue: Secondary | ICD-10-CM

## 2023-09-06 DIAGNOSIS — E785 Hyperlipidemia, unspecified: Secondary | ICD-10-CM | POA: Insufficient documentation

## 2023-09-06 DIAGNOSIS — Z1283 Encounter for screening for malignant neoplasm of skin: Secondary | ICD-10-CM

## 2023-09-06 DIAGNOSIS — N393 Stress incontinence (female) (male): Secondary | ICD-10-CM | POA: Insufficient documentation

## 2023-09-06 DIAGNOSIS — D229 Melanocytic nevi, unspecified: Secondary | ICD-10-CM

## 2023-09-06 DIAGNOSIS — L814 Other melanin hyperpigmentation: Secondary | ICD-10-CM

## 2023-09-06 DIAGNOSIS — K625 Hemorrhage of anus and rectum: Secondary | ICD-10-CM | POA: Insufficient documentation

## 2023-09-06 DIAGNOSIS — W908XXA Exposure to other nonionizing radiation, initial encounter: Secondary | ICD-10-CM

## 2023-09-06 DIAGNOSIS — D1801 Hemangioma of skin and subcutaneous tissue: Secondary | ICD-10-CM

## 2023-09-06 DIAGNOSIS — D485 Neoplasm of uncertain behavior of skin: Secondary | ICD-10-CM

## 2023-09-06 DIAGNOSIS — Z7189 Other specified counseling: Secondary | ICD-10-CM

## 2023-09-06 NOTE — Patient Instructions (Addendum)
 *Ask PCP to check ferritin, recommend it at least being at 50.   Recommend at least 5% OTC Rogaine even if it says it is for men.    Doses of minoxidil for hair loss are considered 'low dose'. This is because the doses used for hair loss are much lower than the doses which are used for conditions such as high blood pressure (hypertension). The doses used for hypertension are 10-40mg  per day.  Side effects are uncommon at the low doses (up to 2.5 mg/day) used to treat hair loss. Potential side effects, more commonly seen at higher doses, include: Increase in hair growth (hypertrichosis) elsewhere on face and body Temporary hair shedding upon starting medication which may last up to 4 weeks Ankle swelling, fluid retention, rapid weight gain more than 5 pounds Low blood pressure and feeling lightheaded or dizzy when standing up quickly Fast or irregular heartbeat Headaches    Due to recent changes in healthcare laws, you may see results of your pathology and/or laboratory studies on MyChart before the doctors have had a chance to review them. We understand that in some cases there may be results that are confusing or concerning to you. Please understand that not all results are received at the same time and often the doctors may need to interpret multiple results in order to provide you with the best plan of care or course of treatment. Therefore, we ask that you please give us  2 business days to thoroughly review all your results before contacting the office for clarification. Should we see a critical lab result, you will be contacted sooner.   If You Need Anything After Your Visit  If you have any questions or concerns for your doctor, please call our main line at [phone] and press option 4 to reach your doctor's medical assistant. If no one answers, please leave a voicemail as directed and we will return your call as soon as possible. Messages left after 4 pm will be answered the following  business day.   You may also send us  a message via MyChart. We typically respond to MyChart messages within 1-2 business days.  For prescription refills, please ask your pharmacy to contact our office. Our fax number is [phone].  If you have an urgent issue when the clinic is closed that cannot wait until the next business day, you can page your doctor at the number below.    Please note that while we do our best to be available for urgent issues outside of office hours, we are not available 24/7.   If you have an urgent issue and are unable to reach us , you may choose to seek medical care at your doctor's office, retail clinic, urgent care center, or emergency room.  If you have a medical emergency, please immediately call 911 or go to the emergency department.  Pager Numbers  - Dr. Hester: [phone]  - Dr. Jackquline: [phone]  - Dr. Claudene: [phone]   In the event of inclement weather, please call our main line at [phone] for an update on the status of any delays or closures.  Dermatology Medication Tips: Please keep the boxes that topical medications come in in order to help keep track of the instructions about where and how to use these. Pharmacies typically print the medication instructions only on the boxes and not directly on the medication tubes.   If your medication is too expensive, please contact our office at [phone] option 4 or send us  a message  through MyChart.   We are unable to tell what your co-pay for medications will be in advance as this is different depending on your insurance coverage. However, we may be able to find a substitute medication at lower cost or fill out paperwork to get insurance to cover a needed medication.   If a prior authorization is required to get your medication covered by your insurance company, please allow us  1-2 business days to complete this process.  Drug prices often vary depending on where the prescription is  filled and some pharmacies may offer cheaper prices.  The website www.goodrx.com contains coupons for medications through different pharmacies. The prices here do not account for what the cost may be with help from insurance (it may be cheaper with your insurance), but the website can give you the price if you did not use any insurance.  - You can print the associated coupon and take it with your prescription to the pharmacy.  - You may also stop by our office during regular business hours and pick up a GoodRx coupon card.  - If you need your prescription sent electronically to a different pharmacy, notify our office through [facility] or by phone at [phone] option 4.     Si Usted Necesita Algo Despus de Su Visita  Tambin puede enviarnos un mensaje a travs de Clinical cytogeneticist. Por lo general respondemos a los mensajes de MyChart en el transcurso de 1 a 2 das hbiles.  Para renovar recetas, por favor pida a su farmacia que se ponga en contacto con nuestra oficina. Randi lakes de fax es [place] [phone].  Si tiene un asunto urgente cuando la clnica est cerrada y que no puede esperar hasta el siguiente da hbil, puede llamar/localizar a su doctor(a) al nmero que aparece a continuacin.   Por favor, tenga en cuenta que aunque hacemos todo lo posible para estar disponibles para asuntos urgentes fuera del horario de [place], no estamos disponibles las 24 horas del da, los 7 [address]  [address] de la [place].   Si tiene un problema urgente y no puede comunicarse con nosotros, puede optar por buscar atencin mdica  en el consultorio de su doctor(a), en una clnica privada, en un centro de atencin urgente o en una sala de emergencias.  Si tiene Engineer, drilling, por favor llame inmediatamente al 911 o vaya a la sala de emergencias.  Nmeros de bper  - Dr. Hester: [phone]  - Dra. Jackquline: [phone]  - Dr. Claudene: [phone]   En caso de inclemencias del tiempo, por favor  llame a landry capes principal al [phone] para una actualizacin sobre el [place] de cualquier retraso o cierre.  Consejos para la medicacin en dermatologa: Por favor, guarde las cajas en las que vienen los medicamentos de uso tpico para ayudarle a seguir las instrucciones sobre dnde y cmo usarlos. Las farmacias generalmente imprimen las instrucciones del medicamento slo en las cajas y no directamente en los tubos del [place].   Si su medicamento es muy caro, por favor, pngase en contacto con landry rieger llamando al [phone] y presione la opcin 4 o envenos un mensaje a travs de Clinical cytogeneticist.   No podemos decirle cul ser su copago por los medicamentos por adelantado ya que esto es diferente dependiendo de la cobertura de su seguro. Sin embargo, es posible que podamos encontrar un medicamento sustituto a Audiological scientist un formulario para que el seguro cubra el medicamento que se considera necesario.   Si se requiere [employer]  autorizacin previa para que su compaa de seguros malta su medicamento, por favor permtanos de 1 a 2 das hbiles para completar este proceso.  Los precios de los medicamentos varan con frecuencia dependiendo del Environmental consultant de dnde se surte la receta y alguna farmacias pueden ofrecer precios ms baratos.  El sitio web www.goodrx.com tiene cupones para medicamentos de Health and safety inspector. Los precios aqu no tienen en cuenta lo que podra costar con la ayuda del seguro (puede ser ms barato con su seguro), pero el sitio web puede darle el precio si no utiliz Tourist information centre manager.  - Puede imprimir el cupn correspondiente y llevarlo con su receta a la farmacia.  - Tambin puede pasar por nuestra oficina durante el horario de atencin regular y Education officer, museum una tarjeta de cupones de GoodRx.  - Si necesita que su receta se enve electrnicamente a una farmacia diferente, informe a nuestra oficina a travs de MyChart de [place] o por telfono llamando al [phone]  y presione la opcin 4.

## 2023-09-06 NOTE — Progress Notes (Signed)
 Follow-Up Visit   Subjective  Sara Herman is a 68 y.o. female who presents for the following: Skin Cancer Screening and Full Body Skin Exam. Patient has noticed hair falling out more and reports she is under a lot of stress, patient would like to know any recommendations, does not have bald spots but notices when she brushes her hair a lot of hair comes off. Patient takes OTC vitamin D. Patient denies any burning or itching at scalp.   The patient presents for Total-Body Skin Exam (TBSE) for skin cancer screening and mole check. The patient has spots, moles and lesions to be evaluated, some may be new or changing and the patient may have concern these could be cancer.   The following portions of the chart were reviewed this encounter and updated as appropriate: medications, allergies, medical history  Review of Systems:  No other skin or systemic complaints except as noted in HPI or Assessment and Plan.  Objective  Well appearing patient in no apparent distress; mood and affect are within normal limits.  A full examination was performed including scalp, head, eyes, ears, nose, lips, neck, chest, axillae, abdomen, back, buttocks, bilateral upper extremities, bilateral lower extremities, hands, feet, fingers, toes, fingernails, and toenails. All findings within normal limits unless otherwise noted below.   Relevant physical exam findings are noted in the Assessment and Plan.    Assessment & Plan   SKIN CANCER SCREENING PERFORMED TODAY.  ACTINIC DAMAGE - Chronic condition, secondary to cumulative UV/sun exposure - diffuse scaly erythematous macules with underlying dyspigmentation - Recommend daily broad spectrum sunscreen SPF 30+ to sun-exposed areas, reapply every 2 hours as needed.  - Staying in the shade or wearing long sleeves, sun glasses (UVA+UVB protection) and wide brim hats (4-inch brim around the entire circumference of the hat) are also recommended for sun  protection.  - Call for new or changing lesions.  LENTIGINES, SEBORRHEIC KERATOSES, HEMANGIOMAS - Benign normal skin lesions - Benign-appearing - Call for any changes  MELANOCYTIC NEVI - Tan-brown and/or pink-flesh-colored symmetric macules and papules - Benign appearing on exam today - Observation - Call clinic for new or changing moles - Recommend daily use of broad spectrum spf 30+ sunscreen to sun-exposed areas.   HISTORY OF PRECANCEROUS ACTINIC KERATOSIS - site(s) of PreCancerous Actinic Keratosis clear today at R ala nasi, lesion resolved.  - these may recur and new lesions may form requiring treatment to prevent transformation into skin cancer - observe for new or changing spots and contact [facility] for appointment if occur - photoprotection with sun protective clothing; sunglasses and broad spectrum sunscreen with SPF of at least 30 + and frequent self skin exams recommended - yearly exams by a dermatologist recommended for persons with history of PreCancerous Actinic Keratoses  Neoplasm of uncertain behavior Exam: Violaceous macule at left superior breast.   Treatment Plan: No clear features of skin cancer on exam today, patient denies any known injury. Recommend patient observe and report promptly any worsening or unexpected persistence.    TELOGEN EFFLUVIUM Scalp Telogen Effluvium Counseling Telogen effluvium is a benign, self-limited condition causing increased hair shedding usually for several months. It does not progress to baldness, and the hair eventually grows back on its own. It can be triggered by recent illness, recent surgery, thyroid disease, low iron stores, vitamin D deficiency, fad diets or rapid weight loss, hormonal changes such as pregnancy or birth control pills, and some medication. Usually the hair loss starts 2-3 months  after the illness or health change. Rarely, it can continue for longer than a year. Treatments options may include oral or  topical Minoxidil; Red Light scalp treatments; Biotin 2.5 mg daily and other options.   Plan: 08/27/23 normal CBC, CMP, and TSH. Discussed checking ferritin levels can order here or patient ask PCP. Patient has upcoming appointment with PCP and will ask to have ferritin checked.  If patient decides to try it, recommended 5% minoxidil topically daily. 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 Claudene.  I, Jacquelynn V. Wilfred, CMA, am acting as scribe for Boneta Claudene, MD .   Documentation: I have reviewed the above documentation for accuracy and completeness, and I agree with the above.  Boneta Claudene, MD

## 2023-09-11 ENCOUNTER — Telehealth: Payer: Self-pay

## 2023-09-11 NOTE — Telephone Encounter (Signed)
 Called patient and left msg for her to return phone call to schedule appt for biopsy. Lonell RAMAN., RMA

## 2023-09-11 NOTE — Telephone Encounter (Signed)
 Patient left message concerning a spot at left breast. She saw you last week for FBSE and this spot was documented in her note. Patient is concerned because she does have hx of breast cancer and wanted your recommendation on how to pursue - should she see you for bx or make appt with gyn? Lonell RAMAN., RMA

## 2024-09-11 ENCOUNTER — Ambulatory Visit: Admitting: Dermatology
